# Patient Record
Sex: Male | Born: 1990 | Race: Black or African American | Hispanic: No | Marital: Single | Smoking: Current every day smoker
Health system: Southern US, Community
[De-identification: ages and names within clinical notes are randomized; demographics above are authoritative.]

## PROBLEM LIST (undated history)

## (undated) DIAGNOSIS — I10 Essential (primary) hypertension: Secondary | ICD-10-CM

## (undated) HISTORY — PX: OTHER SURGICAL HISTORY: SHX169

## (undated) HISTORY — PX: JOINT REPLACEMENT: SHX530

## (undated) HISTORY — PX: ABDOMINAL SURGERY: SHX537

---

## 2012-03-22 ENCOUNTER — Observation Stay: Payer: Self-pay | Admitting: Unknown Physician Specialty

## 2012-03-22 ENCOUNTER — Ambulatory Visit: Payer: Self-pay | Admitting: Unknown Physician Specialty

## 2012-03-26 ENCOUNTER — Emergency Department: Payer: Self-pay | Admitting: Emergency Medicine

## 2012-10-08 HISTORY — PX: LEG SURGERY: SHX1003

## 2013-06-07 ENCOUNTER — Emergency Department: Payer: Self-pay | Admitting: Emergency Medicine

## 2013-06-08 DIAGNOSIS — I770 Arteriovenous fistula, acquired: Secondary | ICD-10-CM | POA: Insufficient documentation

## 2013-06-08 DIAGNOSIS — J984 Other disorders of lung: Secondary | ICD-10-CM | POA: Insufficient documentation

## 2013-06-08 DIAGNOSIS — S42309A Unspecified fracture of shaft of humerus, unspecified arm, initial encounter for closed fracture: Secondary | ICD-10-CM | POA: Insufficient documentation

## 2013-06-08 DIAGNOSIS — D62 Acute posthemorrhagic anemia: Secondary | ICD-10-CM | POA: Insufficient documentation

## 2013-07-22 DIAGNOSIS — S3022XA Contusion of scrotum and testes, initial encounter: Secondary | ICD-10-CM | POA: Insufficient documentation

## 2013-07-29 ENCOUNTER — Emergency Department: Payer: Self-pay | Admitting: Emergency Medicine

## 2014-06-11 ENCOUNTER — Emergency Department: Payer: Self-pay | Admitting: Emergency Medicine

## 2014-06-11 LAB — COMPREHENSIVE METABOLIC PANEL
ALK PHOS: 98 U/L
ALT: 28 U/L
Albumin: 3.7 g/dL (ref 3.4–5.0)
Anion Gap: 10 (ref 7–16)
BILIRUBIN TOTAL: 0.2 mg/dL (ref 0.2–1.0)
BUN: 10 mg/dL (ref 7–18)
CHLORIDE: 106 mmol/L (ref 98–107)
CO2: 27 mmol/L (ref 21–32)
Calcium, Total: 9 mg/dL (ref 8.5–10.1)
Creatinine: 1.23 mg/dL (ref 0.60–1.30)
EGFR (Non-African Amer.): >60
Glucose: 87 mg/dL (ref 65–99)
OSMOLALITY: 283 (ref 275–301)
Potassium: 3.9 mmol/L (ref 3.5–5.1)
SGOT(AST): 29 U/L (ref 15–37)
Sodium: 143 mmol/L (ref 136–145)
Total Protein: 8.1 g/dL (ref 6.4–8.2)

## 2014-06-11 LAB — CBC WITH DIFFERENTIAL/PLATELET
BASOS PCT: 0.3 %
Basophil #: 0 10*3/uL (ref 0.0–0.1)
EOS ABS: 0.1 10*3/uL (ref 0.0–0.7)
EOS PCT: 1.3 %
HCT: 44.7 % (ref 40.0–52.0)
HGB: 14.4 g/dL (ref 13.0–18.0)
LYMPHS PCT: 24.2 %
Lymphocyte #: 2.8 10*3/uL (ref 1.0–3.6)
MCH: 28.3 pg (ref 26.0–34.0)
MCHC: 32.2 g/dL (ref 32.0–36.0)
MCV: 88 fL (ref 80–100)
MONO ABS: 1 x10 3/mm (ref 0.2–1.0)
MONOS PCT: 8.7 %
NEUTROS ABS: 7.5 10*3/uL — AB (ref 1.4–6.5)
NEUTROS PCT: 65.5 %
PLATELETS: 249 10*3/uL (ref 150–440)
RBC: 5.08 10*6/uL (ref 4.40–5.90)
RDW: 13.6 % (ref 11.5–14.5)
WBC: 11.5 10*3/uL — ABNORMAL HIGH (ref 3.8–10.6)

## 2014-06-11 LAB — D-DIMER(ARMC): D-Dimer: 263 ng/ml

## 2015-01-30 NOTE — Op Note (Signed)
PATIENT NAME:  Jorge Lozano, Jorge Lozano MR#:  [id number] DATE OF BIRTH:  1991-09-26  DATE OF PROCEDURE:  03/22/2012  PREOPERATIVE DIAGNOSIS: Bimalleolar fracture, right ankle.   POSTOPERATIVE DIAGNOSIS: Trimalleolar fracture, right ankle.   PROCEDURE: Open reduction internal fixation trimalleolar fracture, medial and lateral malleoli.   SURGEON: Winn JockJames C. Gabrille Kilbride, MD    ASSISTANT: None.   ANESTHESIA: Spinal.   ESTIMATED BLOOD LOSS: Negligible.   COMPLICATIONS: None.   TOURNIQUET PRESSURE: 300 mmHg.   TOURNIQUET TIME: 42 minutes.   IMPLANTS USED: Two 4.0 mm cannulated screws medially, an 8-hole one third tubular plate laterally, locking and nonlocking screws, two anterior to posterior lag screws.   COMPLICATIONS: None.   BRIEF CLINICAL NOTE AND PATHOLOGY: The patient suffered the above-mentioned fracture the day of surgery, presented to the Emergency Room early in the morning. This was reduced by the Emergency Room physician and I was consulted. The options, risks, and benefits were discussed. The patient elected to proceed with operative intervention. The patient understood the possibility of the need for hardware removal, further surgery. At the time of the procedure, the fracture reduced very well. There was good fixation. There was a small posterior lip fracture palpated.   DESCRIPTION OF PROCEDURE: Preop antibiotics, adequate spinal anesthesia, supine position, routine prepping and draping. Appropriate timeout was called. The leg was elevated after thorough scrubbing and prepping, the toes were protected with a glove. The tourniquet was inflated. A routine posterior approach was made to the fibula. It was reduced anatomically. It was held with towel clips while two screws were placed in a lag fashion from anterior to posterior.   The plate was then applied laterally. The distal portion was contoured so as not to be prominent. It was applied with a combination of locking and nonlocking  screws, the cortical screws were placed first to compress the plate nicely against the bone.   AP and lateral views showed good positioning of hardware. The anterior to posterior screws were palpated, and there was felt not to be excessive protrusion posteriorly.   Attention was then turned medially where an anteromedial incision was made over the distal tibia. Neurovascular structures were protected. Soft tissues were protected, and the fracture was exposed. The fracture site was irrigated,  reduced anatomically, and held with a towel clip while two guidewires were placed. These were checked with fluoroscopy. The two screws were then placed in routine fashion, both with excellent compression.   The tourniquet was released. AP, lateral, and mortise views showed good positioning. Hemostasis was good. The incision was thoroughly irrigated. They were closed with 0 Vicryl and staples. Soft sterile dressing was applied, followed by a posterior splint. The patient was awakened and taken to the postanesthesia care unit having tolerated the procedure well. Sponge and needle counts were reported as correct prior to and after wound closure.    ____________________________ Winn JockJames C. Gerrit Heckaliff, MD jcc:cbb Lozano: 03/22/2012 15:10:47 ET T: 03/22/2012 15:22:19 ET JOB#: [id number]  cc: Winn JockJames C. Gerrit Heckaliff, MD, <Dictator> Winn JockJAMES C Hilmer Aliberti MD ELECTRONICALLY SIGNED 03/23/2012 14:41

## 2015-01-30 NOTE — H&P (Signed)
Subjective/Chief Complaint Fell, twisted right ankle early AM    History of Present Illness Twisted last PM. Brought to ER where xrays showed bimall fx/dislocation. Reduced in ER and splinted. Admitted for surgery    Past History Neg   Past Med/Surgical Hx:  Right Ankle Fracture:   ALLERGIES:  No Known Allergies:   Family and Social History:   Family History Non-Contributory    Social History positive  tobacco, positive ETOH, negative Illicit drugs    + Tobacco Current (within 1 year)    Place of Living Home   Review of Systems:   Fever/Chills No    Cough No    Sputum No    Abdominal Pain No    Diarrhea No    Constipation No    Nausea/Vomiting No    SOB/DOE No    Chest Pain No    Dysuria No    Tolerating Diet Yes    Medications/Allergies Reviewed Medications/Allergies reviewed   Physical Exam:   GEN well developed    HEENT PERRL    NECK supple    RESP normal resp effort  clear BS    CARD regular rate    ABD denies tenderness  soft  normal BS    LYMPH negative neck    EXTR Right ankle in splint. NV intact. No other pain    SKIN normal to palpation    NEURO motor/sensory function intact    PSYCH A+O to time, place, person   Radiology Results: XRay:    15-Jun-13 05:16, Ankle Right AP and Lateral   Ankle Right AP and Lateral   REASON FOR EXAM:    ASSAULTED, ANKLE DEFORMITY  COMMENTS:   Bedside (portable):Y    PROCEDURE: DXR - DXR ANKLE RIGHT AP AND LATERAL  - Mar 22 2012  5:16AM     RESULT: AP and lateral views of the right ankle are submitted. The   patient has sustained a fracture-dislocation of the ankle. A spiral   fracture of the distal fibular metadiaphysis and of the medial malleolus   is present. There is considerable angulation of the fibular fracture and   there is marked displacement of the medial malleolus.The ankle joint   mortise is disrupted.    IMPRESSION:  The patient sustained a bimalleolar fracture-dislocation  of   the right ankle.      Verified By: DAVID A. [place], M.D., MD    15-Jun-13 06:27, Ankle Right AP and Lateral   Ankle Right AP and Lateral   REASON FOR EXAM:    POST-REDUCTION  COMMENTS:   Bedside (portable):Y    PROCEDURE: DXR - DXR ANKLE RIGHT AP AND LATERAL  - Mar 22 2012  6:27AM     RESULT: The patient had recently sustained a bimalleolar fracture. The   patient has undergone closed reduction. Alignment of the distal fibular   fracture as well as of the medial malleolar fracture is now more nearly   anatomic. The ankle joint mortise remains disrupted. The patient is in   plaster.    IMPRESSION:  There has been partial reduction ofthe bimalleolar fracture   of the right ankle.     Dictation Site: 5    Verified By: DAVID A. [place], M.D., MD     Assessment/Admission Diagnosis Right ankle bimalleollar fracture/dislocation reduced in ER with widened mortise still. Will proceed with ORIF medial and lateral. Procedure, risks and benefits discussed. All questions answered.    Plan As above   Electronic Signatures:  Celesta Gentilealiff, Shanvi Moyd C (MD)  (Signed 15-Jun-13 11:01)  Authored: CHIEF COMPLAINT and HISTORY, PAST MEDICAL/SURGIAL HISTORY, ALLERGIES, FAMILY AND SOCIAL HISTORY, REVIEW OF SYSTEMS, PHYSICAL EXAM, Radiology, ASSESSMENT AND PLAN   Last Updated: 15-Jun-13 11:01 by Celesta Gentilealiff, Lera Gaines C (MD)

## 2015-02-14 ENCOUNTER — Encounter: Payer: Self-pay | Admitting: Emergency Medicine

## 2015-02-14 ENCOUNTER — Emergency Department
Admission: EM | Admit: 2015-02-14 | Discharge: 2015-02-14 | Disposition: A | Discharge status: Home / Self Care | Payer: Self-pay | Attending: Emergency Medicine | Admitting: Emergency Medicine

## 2015-02-14 DIAGNOSIS — I10 Essential (primary) hypertension: Secondary | ICD-10-CM | POA: Insufficient documentation

## 2015-02-14 DIAGNOSIS — M545 Low back pain, unspecified: Secondary | ICD-10-CM

## 2015-02-14 DIAGNOSIS — Z720 Tobacco use: Secondary | ICD-10-CM | POA: Insufficient documentation

## 2015-02-14 HISTORY — DX: Essential (primary) hypertension: I10

## 2015-02-14 MED ORDER — IBUPROFEN 800 MG PO TABS
ORAL_TABLET | ORAL | Status: DC
Start: 2015-02-14 — End: 2015-02-15
  Filled 2015-02-14: qty 1

## 2015-02-14 MED ORDER — CYCLOBENZAPRINE HCL 10 MG PO TABS
10.0000 mg | ORAL_TABLET | Freq: Once | ORAL | Status: AC
  Administered 2015-02-14: 10 mg via ORAL

## 2015-02-14 MED ORDER — CYCLOBENZAPRINE HCL 10 MG PO TABS
ORAL_TABLET | ORAL | Status: AC
  Administered 2015-02-14: 10 mg via ORAL
  Filled 2015-02-14: qty 1

## 2015-02-14 MED ORDER — KETOROLAC TROMETHAMINE 10 MG PO TABS: 10.0000 mg | ORAL_TABLET | Freq: Three times a day (TID) | ORAL | Status: DC

## 2015-02-14 MED ORDER — IBUPROFEN 800 MG PO TABS
800.0000 mg | ORAL_TABLET | Freq: Once | ORAL | Status: AC
  Administered 2015-02-14: 800 mg via ORAL

## 2015-02-14 MED ORDER — CYCLOBENZAPRINE HCL 5 MG PO TABS: 5.0000 mg | ORAL_TABLET | Freq: Three times a day (TID) | ORAL | Status: DC | PRN
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
## 2015-02-14 NOTE — Discharge Instructions (Signed)
Back Injury Prevention °Back injuries can be extremely painful and difficult to heal. After having one back injury, you are much more likely to experience another later on. It is important to learn how to avoid injuring or re-injuring your back. The following tips can help you to prevent a back injury. °PHYSICAL FITNESS °· Exercise regularly and try to develop good tone in your abdominal muscles. Your abdominal muscles provide a lot of the support needed by your back. °· Do aerobic exercises (walking, jogging, biking, swimming) regularly. °· Do exercises that increase balance and strength (tai chi, yoga) regularly. This can decrease your risk of falling and injuring your back. °· Stretch before and after exercising. °· Maintain a healthy weight. The more you weigh, the more stress is placed on your back. For every pound of weight, 10 times that amount of pressure is placed on the back. °DIET °· Talk to your caregiver about how much calcium and vitamin D you need per day. These nutrients help to prevent weakening of the bones (osteoporosis). Osteoporosis can cause broken (fractured) bones that lead to back pain. °· Include good sources of calcium in your diet, such as dairy products, green, leafy vegetables, and products with calcium added (fortified). °· Include good sources of vitamin D in your diet, such as milk and foods that are fortified with vitamin D. °· Consider taking a nutritional supplement or a multivitamin if needed. °· Stop smoking if you smoke. °POSTURE °· Sit and stand up straight. Avoid leaning forward when you sit or hunching over when you stand. °· Choose chairs with good low back (lumbar) support. °· If you work at a desk, sit close to your work so you do not need to lean over. Keep your chin tucked in. Keep your neck drawn back and elbows bent at a right angle. Your arms should look like the letter "L." °· Sit high and close to the steering wheel when you drive. Add a lumbar support to your car  seat if needed. °· Avoid sitting or standing in one position for too long. Take breaks to get up, stretch, and walk around at least once every hour. Take breaks if you are driving for long periods of time. °· Sleep on your side with your knees slightly bent, or sleep on your back with a pillow under your knees. Do not sleep on your stomach. °LIFTING, TWISTING, AND REACHING °· Avoid heavy lifting, especially repetitive lifting. If you must do heavy lifting: °· Stretch before lifting. °· Work slowly. °· Rest between lifts. °· Use carts and dollies to move objects when possible. °· Make several small trips instead of carrying 1 heavy load. °· Ask for help when you need it. °· Ask for help when moving big, awkward objects. °· Follow these steps when lifting: °· Stand with your feet shoulder-width apart. °· Get as close to the object as you can. Do not try to pick up heavy objects that are far from your body. °· Use handles or lifting straps if they are available. °· Bend at your knees. Squat down, but keep your heels off the floor. °· Keep your shoulders pulled back, your chin tucked in, and your back straight. °· Lift the object slowly, tightening the muscles in your legs, abdomen, and buttocks. Keep the object as close to the center of your body as possible. °· When you put a load down, use these same guidelines in reverse. °· Do not: °· Lift the object above your waist. °·   Twist at the waist while lifting or carrying a load. Move your feet if you need to turn, not your waist.  Bend over without bending at your knees.  Avoid reaching over your head, across a table, or for an object on a high surface. OTHER TIPS  Avoid wet floors and keep sidewalks clear of ice to prevent falls.  Do not sleep on a mattress that is too soft or too hard.  Keep items that are used frequently within easy reach.  Put heavier objects on shelves at waist level and lighter objects on lower or higher shelves.  Find ways to  decrease your stress, such as exercise, massage, or relaxation techniques. Stress can build up in your muscles. Tense muscles are more vulnerable to injury.  Seek treatment for depression or anxiety if needed. These conditions can increase your risk of developing back pain. SEEK MEDICAL CARE IF:  You injure your back.  You have questions about diet, exercise, or other ways to prevent back injuries. MAKE SURE YOU:  Understand these instructions.  Will watch your condition.  Will get help right away if you are not doing well or get worse. Document Released: 11/01/2004 Document Revised: 12/17/2011 Document Reviewed: 11/05/2011 [employer] Patient Information 2015 [employer]. This information is not intended to replace advice given to you by your health care provider. Make sure you discuss any questions you have with your health care provider.  Back Pain, Adult Low back pain is very common. About 1 in 5 people have back pain.The cause of low back pain is rarely dangerous. The pain often gets better over time.About half of people with a sudden onset of back pain feel better in just 2 weeks. About 8 in 10 people feel better by 6 weeks.  CAUSES Some common causes of back pain include:  Strain of the muscles or ligaments supporting the spine.  Wear and tear (degeneration) of the spinal discs.  Arthritis.  Direct injury to the back. DIAGNOSIS Most of the time, the direct cause of low back pain is not known.However, back pain can be treated effectively even when the exact cause of the pain is unknown.Answering your caregiver's questions about your overall health and symptoms is one of the most accurate ways to make sure the cause of your pain is not dangerous. If your caregiver needs more information, he or she may order lab work or imaging tests (X-rays or MRIs).However, even if imaging tests show changes in your back, this usually does not require surgery. HOME CARE INSTRUCTIONS For  many people, back pain returns.Since low back pain is rarely dangerous, it is often a condition that people can learn to manageon their own.   Remain active. It is stressful on the back to sit or stand in one place. Do not sit, drive, or stand in one place for more than 30 minutes at a time. Take short walks on level surfaces as soon as pain allows.Try to increase the length of time you walk each day.  Do not stay in bed.Resting more than 1 or 2 days can delay your recovery.  Do not avoid exercise or work.Your body is made to move.It is not dangerous to be active, even though your back may hurt.Your back will likely heal faster if you return to being active before your pain is gone.  Pay attention to your body when you bend and lift. Many people have less discomfortwhen lifting if they bend their knees, keep the load close to their bodies,and   avoid twisting. Often, the most comfortable positions are those that put less stress on your recovering back.  Find a comfortable position to sleep. Use a firm mattress and lie on your side with your knees slightly bent. If you lie on your back, put a pillow under your knees.  Only take over-the-counter or prescription medicines as directed by your caregiver. Over-the-counter medicines to reduce pain and inflammation are often the most helpful.Your caregiver may prescribe muscle relaxant drugs.These medicines help dull your pain so you can more quickly return to your normal activities and healthy exercise.  Put ice on the injured area.  Put ice in a plastic bag.  Place a towel between your skin and the bag.  Leave the ice on for 15-20 minutes, 03-04 times a day for the first 2 to 3 days. After that, ice and heat may be alternated to reduce pain and spasms.  Ask your caregiver about trying back exercises and gentle massage. This may be of some benefit.  Avoid feeling anxious or stressed.Stress increases muscle tension and can worsen back  pain.It is important to recognize when you are anxious or stressed and learn ways to manage it.Exercise is a great option. SEEK MEDICAL CARE IF:  You have pain that is not relieved with rest or medicine.  You have pain that does not improve in 1 week.  You have new symptoms.  You are generally not feeling well. SEEK IMMEDIATE MEDICAL CARE IF:   You have pain that radiates from your back into your legs.  You develop new bowel or bladder control problems.  You have unusual weakness or numbness in your arms or legs.  You develop nausea or vomiting.  You develop abdominal pain.  You feel faint. Document Released: 09/24/2005 Document Revised: 03/25/2012 Document Reviewed: 01/26/2014 [facility] Patient Information 2015 [place], [place]. This information is not intended to replace advice given to you by your health care provider. Make sure you discuss any questions you have with your health care provider.  Take the prescription meds a directed.  Follow-up with [facility] as needed.
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
## 2015-02-16 ENCOUNTER — Emergency Department: Payer: Self-pay

## 2015-02-16 ENCOUNTER — Emergency Department
Admission: EM | Admit: 2015-02-16 | Discharge: 2015-02-16 | Disposition: A | Discharge status: Home / Self Care | Payer: Self-pay | Attending: Emergency Medicine | Admitting: Emergency Medicine

## 2015-02-16 DIAGNOSIS — Z79899 Other long term (current) drug therapy: Secondary | ICD-10-CM | POA: Insufficient documentation

## 2015-02-16 DIAGNOSIS — I10 Essential (primary) hypertension: Secondary | ICD-10-CM | POA: Insufficient documentation

## 2015-02-16 DIAGNOSIS — W3400XS Accidental discharge from unspecified firearms or gun, sequela: Secondary | ICD-10-CM

## 2015-02-16 DIAGNOSIS — Z720 Tobacco use: Secondary | ICD-10-CM | POA: Insufficient documentation

## 2015-02-16 DIAGNOSIS — S21409S Unspecified open wound of unspecified back wall of thorax with penetration into thoracic cavity, sequela: Secondary | ICD-10-CM | POA: Insufficient documentation

## 2015-02-16 MED ORDER — HYDROCODONE-ACETAMINOPHEN 5-325 MG PO TABS
ORAL_TABLET | ORAL | Status: AC
  Filled 2015-02-16: qty 2

## 2015-02-16 MED ORDER — IBUPROFEN 800 MG PO TABS: 800.0000 mg | ORAL_TABLET | Freq: Three times a day (TID) | ORAL | Status: DC | PRN

## 2015-02-16 MED ORDER — METHOCARBAMOL 500 MG PO TABS: 500.0000 mg | ORAL_TABLET | Freq: Four times a day (QID) | ORAL | Status: DC | PRN

## 2015-02-16 MED ORDER — HYDROCODONE-ACETAMINOPHEN 5-325 MG PO TABS
2.0000 | ORAL_TABLET | Freq: Once | ORAL | Status: AC
  Administered 2015-02-16: 2 via ORAL

## 2015-02-16 MED ORDER — KETOROLAC TROMETHAMINE 60 MG/2ML IM SOLN
INTRAMUSCULAR | Status: AC
  Administered 2015-02-16: 60 mg
  Filled 2015-02-16: qty 2

## 2015-02-16 MED ORDER — KETOROLAC TROMETHAMINE 30 MG/ML IJ SOLN: 60.0000 mg | Freq: Once | INTRAMUSCULAR | Status: AC

## 2015-02-16 MED ORDER — HYDROCODONE-ACETAMINOPHEN 5-325 MG PO TABS
1.0000 | ORAL_TABLET | ORAL | Status: DC | PRN
Start: 2015-02-16 — End: 2019-11-07
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
## 2015-06-22 ENCOUNTER — Emergency Department: Payer: Self-pay

## 2015-06-22 ENCOUNTER — Encounter: Payer: Self-pay | Admitting: Emergency Medicine

## 2015-06-22 ENCOUNTER — Emergency Department
Admission: EM | Admit: 2015-06-22 | Discharge: 2015-06-22 | Disposition: A | Discharge status: Home / Self Care | Payer: Self-pay | Attending: Emergency Medicine | Admitting: Emergency Medicine

## 2015-06-22 DIAGNOSIS — I10 Essential (primary) hypertension: Secondary | ICD-10-CM | POA: Insufficient documentation

## 2015-06-22 DIAGNOSIS — Z720 Tobacco use: Secondary | ICD-10-CM | POA: Insufficient documentation

## 2015-06-22 DIAGNOSIS — Z791 Long term (current) use of non-steroidal anti-inflammatories (NSAID): Secondary | ICD-10-CM | POA: Insufficient documentation

## 2015-06-22 DIAGNOSIS — J208 Acute bronchitis due to other specified organisms: Secondary | ICD-10-CM

## 2015-06-22 MED ORDER — ALBUTEROL SULFATE HFA 108 (90 BASE) MCG/ACT IN AERS: INHALATION_SPRAY | RESPIRATORY_TRACT | Status: DC
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
## 2015-06-22 NOTE — ED Notes (Signed)
Patient to ED with report of 4-5 day history of cough, congestion and muscle soreness from coughing. Patient reports mildly productive cough.

## 2015-06-22 NOTE — Discharge Instructions (Signed)
You have been seen in the Emergency Department (ED) today for a likely viral illness.  Please drink plenty of clear fluids (water, Gatorade, chicken broth, etc).  You may use Tylenol and/or Motrin according to label instructions.  You can alternate between the two without any side effects.   Please follow up with your doctor as listed above.  Call your doctor or return to the Emergency Department (ED) if you are unable to tolerate fluids due to vomiting, have worsening trouble breathing, become extremely tired or difficult to awaken, or if you develop any other symptoms that concern you.   Acute Bronchitis Bronchitis is when the airways that extend from the windpipe into the lungs get red, puffy, and painful (inflamed). Bronchitis often causes thick spit (mucus) to develop. This leads to a cough. A cough is the most common symptom of bronchitis. In acute bronchitis, the condition usually begins suddenly and goes away over time (usually in 2 weeks). Smoking, allergies, and asthma can make bronchitis worse. Repeated episodes of bronchitis may cause more lung problems. HOME CARE  Rest.  Drink enough fluids to keep your pee (urine) clear or pale yellow (unless you need to limit fluids as told by your doctor).  Only take over-the-counter or prescription medicines as told by your doctor.  Avoid smoking and secondhand smoke. These can make bronchitis worse. If you are a smoker, think about using nicotine gum or skin patches. Quitting smoking will help your lungs heal faster.  Reduce the chance of getting bronchitis again by:  Washing your hands often.  Avoiding people with cold symptoms.  Trying not to touch your hands to your mouth, nose, or eyes.  Follow up with your doctor as told. GET HELP IF: Your symptoms do not improve after 1 week of treatment. Symptoms include:  Cough.  Fever.  Coughing up thick spit.  Body aches.  Chest congestion.  Chills.  Shortness of breath.  Sore  throat. GET HELP RIGHT AWAY IF:   You have an increased fever.  You have chills.  You have severe shortness of breath.  You have bloody thick spit (sputum).  You throw up (vomit) often.  You lose too much body fluid (dehydration).  You have a severe headache.  You faint. MAKE SURE YOU:   Understand these instructions.  Will watch your condition.  Will get help right away if you are not doing well or get worse. Document Released: 03/12/2008 Document Revised: 05/27/2013 Document Reviewed: 03/17/2013 [facility] Patient Information 2015 [place], [place]. This information is not intended to replace advice given to you by your health care provider. Make sure you discuss any questions you have with your health care provider.  Viral Infections A viral infection can be caused by different types of viruses.Most viral infections are not serious and resolve on their own. However, some infections may cause severe symptoms and may lead to further complications. SYMPTOMS Viruses can frequently cause:  Minor sore throat.  Aches and pains.  Headaches.  Runny nose.  Different types of rashes.  Watery eyes.  Tiredness.  Cough.  Loss of appetite.  Gastrointestinal infections, resulting in nausea, vomiting, and diarrhea. These symptoms do not respond to antibiotics because the infection is not caused by bacteria. However, you might catch a bacterial infection following the viral infection. This is sometimes called a "superinfection." Symptoms of such a bacterial infection may include:  Worsening sore throat with pus and difficulty swallowing.  Swollen neck glands.  Chills and a high or persistent fever.  Severe headache.  Tenderness over the sinuses.  Persistent overall ill feeling (malaise), muscle aches, and tiredness (fatigue).  Persistent cough.  Yellow, green, or brown mucus production with coughing. HOME CARE INSTRUCTIONS   Only take over-the-counter or  prescription medicines for pain, discomfort, diarrhea, or fever as directed by your caregiver.  Drink enough water and fluids to keep your urine clear or pale yellow. Sports drinks can provide valuable electrolytes, sugars, and hydration.  Get plenty of rest and maintain proper nutrition. Soups and broths with crackers or rice are fine. SEEK IMMEDIATE MEDICAL CARE IF:   You have severe headaches, shortness of breath, chest pain, neck pain, or an unusual rash.  You have uncontrolled vomiting, diarrhea, or you are unable to keep down fluids.  You or your child has an oral temperature above 102 F (38.9 C), not controlled by medicine.  Your baby is older than 3 months with a rectal temperature of 102 F (38.9 C) or higher.  Your baby is 37 months old or younger with a rectal temperature of 100.4 F (38 C) or higher. MAKE SURE YOU:   Understand these instructions.  Will watch your condition.  Will get help right away if you are not doing well or get worse. Document Released: 07/04/2005 Document Revised: 12/17/2011 Document Reviewed: 01/29/2011 [facility] Patient Information 2015 [place], [place]. This information is not intended to replace advice given to you by your health care provider. Make sure you discuss any questions you have with your health care provider.
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
## 2018-05-22 ENCOUNTER — Encounter: Payer: Self-pay | Admitting: Emergency Medicine

## 2018-05-22 ENCOUNTER — Emergency Department
Admission: EM | Admit: 2018-05-22 | Discharge: 2018-05-23 | Disposition: A | Discharge status: Home / Self Care | Payer: Self-pay | Attending: Emergency Medicine | Admitting: Emergency Medicine

## 2018-05-22 ENCOUNTER — Other Ambulatory Visit: Payer: Self-pay

## 2018-05-22 DIAGNOSIS — G8929 Other chronic pain: Secondary | ICD-10-CM | POA: Insufficient documentation

## 2018-05-22 DIAGNOSIS — I10 Essential (primary) hypertension: Secondary | ICD-10-CM | POA: Insufficient documentation

## 2018-05-22 DIAGNOSIS — M545 Low back pain: Secondary | ICD-10-CM | POA: Insufficient documentation

## 2018-05-22 DIAGNOSIS — Z79899 Other long term (current) drug therapy: Secondary | ICD-10-CM | POA: Insufficient documentation

## 2018-05-22 DIAGNOSIS — F17200 Nicotine dependence, unspecified, uncomplicated: Secondary | ICD-10-CM | POA: Insufficient documentation

## 2018-05-22 DIAGNOSIS — M549 Dorsalgia, unspecified: Secondary | ICD-10-CM

## 2018-05-22 DIAGNOSIS — R059 Cough, unspecified: Secondary | ICD-10-CM

## 2018-05-22 DIAGNOSIS — R05 Cough: Secondary | ICD-10-CM

## 2018-05-22 MED ORDER — SODIUM CHLORIDE 0.9 % IV BOLUS
1000.0000 mL | Freq: Once | INTRAVENOUS | Status: AC
  Administered 2018-05-22: 1000 mL via INTRAVENOUS

## 2018-05-22 MED ORDER — PROCHLORPERAZINE EDISYLATE 10 MG/2ML IJ SOLN
10.0000 mg | Freq: Once | INTRAMUSCULAR | Status: AC
  Administered 2018-05-22: 10 mg via INTRAVENOUS
  Filled 2018-05-22: qty 2
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
## 2018-05-23 NOTE — ED Notes (Signed)
Pt states he wants to leave AMA. Explained to pt he needed to increase fluid intake if he left. MD aware. MD agrees to d/c.

## 2018-05-23 NOTE — Discharge Instructions (Addendum)
Please seek medical attention for any high fevers, chest pain, shortness of breath, change in behavior, persistent vomiting, bloody stool or any other new or concerning symptoms.  

## 2019-08-17 ENCOUNTER — Other Ambulatory Visit: Payer: Self-pay

## 2019-11-07 ENCOUNTER — Emergency Department
Admission: EM | Admit: 2019-11-07 | Discharge: 2019-11-07 | Disposition: A | Discharge status: Home / Self Care | Payer: Self-pay | Attending: Emergency Medicine | Admitting: Emergency Medicine

## 2019-11-07 ENCOUNTER — Encounter: Payer: Self-pay | Admitting: Emergency Medicine

## 2019-11-07 ENCOUNTER — Other Ambulatory Visit: Payer: Self-pay

## 2019-11-07 DIAGNOSIS — H9201 Otalgia, right ear: Secondary | ICD-10-CM | POA: Insufficient documentation

## 2019-11-07 DIAGNOSIS — R519 Headache, unspecified: Secondary | ICD-10-CM | POA: Insufficient documentation

## 2019-11-07 DIAGNOSIS — J029 Acute pharyngitis, unspecified: Secondary | ICD-10-CM

## 2019-11-07 DIAGNOSIS — R070 Pain in throat: Secondary | ICD-10-CM | POA: Insufficient documentation

## 2019-11-07 DIAGNOSIS — Z20822 Contact with and (suspected) exposure to covid-19: Secondary | ICD-10-CM | POA: Insufficient documentation

## 2019-11-07 DIAGNOSIS — I10 Essential (primary) hypertension: Secondary | ICD-10-CM | POA: Insufficient documentation

## 2019-11-07 DIAGNOSIS — F17200 Nicotine dependence, unspecified, uncomplicated: Secondary | ICD-10-CM | POA: Insufficient documentation

## 2019-11-07 LAB — INFLUENZA PANEL BY PCR (TYPE A & B)
Influenza A By PCR: NEGATIVE
Influenza B By PCR: NEGATIVE

## 2019-11-07 MED ORDER — DEXAMETHASONE SODIUM PHOSPHATE 10 MG/ML IJ SOLN
10.0000 mg | Freq: Once | INTRAMUSCULAR | Status: AC
  Administered 2019-11-07: 16:00:00 10 mg via INTRAMUSCULAR
  Filled 2019-11-07: qty 1

## 2019-11-07 MED ORDER — FLUTICASONE PROPIONATE 50 MCG/ACT NA SUSP: 1.0000 | Freq: Every day | NASAL | 0 refills | Status: DC

## 2019-11-07 MED ORDER — CETIRIZINE HCL 10 MG PO TABS: 10.0000 mg | ORAL_TABLET | Freq: Every day | ORAL | 0 refills | Status: DC

## 2019-11-07 NOTE — ED Provider Notes (Signed)
[facility] Emergency Department Provider Note ____________________________________________  Time seen: 1525  I have reviewed the triage vital signs and the nursing notes.  HISTORY  Chief Complaint  Headache   HPI Jorge Lozano is a 29 y.o. male presents to the ER today with complaint of right-sided headache, facial pain, nasal congestion, ear pain and sore throat.  He reports this started 2 days ago.  He describes the headache as pressure.  He reports some watery drainage from his right eye but denies dizziness or visual changes.  He is blowing yellow mucus out of his nose.  He describes the ear pain as fullness without discharge or loss of hearing.  He denies difficulty swallowing.  He denies runny nose, loss of taste/smell, cough or shortness of breath.  He denies fever, chills or body aches.  He has a history of allergies and he does smoke daily.  He has not had sick contacts or exposure to Covid that he is aware of.  He has not taken any medications over-the-counter prior to his arrival.  Past Medical History:  Diagnosis Date  . Hypertension     There are no problems to display for this patient.   Past Surgical History:  Procedure Laterality Date  . ABDOMINAL SURGERY    . JOINT REPLACEMENT     left arm surgery; rod in left arm  . LEG SURGERY Left 2014  . right ankle      Prior to Admission medications   Medication Sig Start Date End Date Taking? Authorizing Provider  cetirizine (ZYRTEC ALLERGY) 10 MG tablet Take 1 tablet (10 mg total) by mouth daily. 11/07/19 11/06/20  Jearld Fenton, NP  fluticasone (FLONASE) 50 MCG/ACT nasal spray Place 1 spray into both nostrils daily. 11/07/19 11/06/20  Jearld Fenton, NP  albuterol (PROVENTIL HFA;VENTOLIN HFA) 108 (90 BASE) MCG/ACT inhaler Inhale 4-6 puffs by mouth every 4 hours as needed for wheezing, cough, and/or shortness of breath 06/22/15 11/07/19  Hinda Kehr, MD    Allergies Morphine and  related  Family History  Problem Relation Age of Onset  . Hypertension Mother   . Hypertension Father     Social History Social History   Tobacco Use  . Smoking status: Current Every Day Smoker  . Smokeless tobacco: Never Used  Substance Use Topics  . Alcohol use: Yes  . Drug use: No    Review of Systems  Constitutional: Negative for fever, chills or body aches. Eyes: Negative for visual changes. ENT: Positive for facial pain, nasal congestion, ear pain and sore throat. Negative for runny nose or cough.  Cardiovascular: Negative for chest pain or chest tightness. Respiratory: Negative for cough, shortness of breath. Gastrointestinal: Negative for nausea, vomiting and diarrhea. Neurological: Positive for headache. Negative for focal weakness, tingling or numbness. ____________________________________________  PHYSICAL EXAM:  VITAL SIGNS: ED Triage Vitals  Enc Vitals Group     BP 11/07/19 1405 (!) 136/96     Pulse Rate 11/07/19 1405 92     Resp 11/07/19 1405 20     Temp 11/07/19 1405 98.6 F (37 C)     Temp Source 11/07/19 1405 Oral     SpO2 11/07/19 1405 98 %     Weight 11/07/19 1403 290 lb (131.5 kg)     Height 11/07/19 1403 6\' 2"  (1.88 m)     Head Circumference --      Peak Flow --      Pain Score 11/07/19 1403 9  Pain Loc --      Pain Edu? --      Excl. in GC? --     Constitutional: Alert and oriented. Ill appearing, but in no distress. Head: Normocephalic. Pain with palpation of right frontal, ethmoidal and maxillary sinuses. Eyes: Conjunctivae are normal. PERRL. Normal extraocular movements. Right periorbital edema noted. Ears: Canals clear. TMs intact bilaterally. + serous effusion on the right. Nose: Erythema noted of right nasal turbinates. Mouth/Throat: Mucous membranes are moist. No posterior pharynx erythema or exudate noted. Uvula rises midline.  Hematological/Lymphatic/Immunological: No cervical lymphadenopathy. Cardiovascular: Normal rate,  regular rhythm.  Respiratory: Normal respiratory effort. No wheezes/rales/rhonchi. Neurologic:  Normal gait without ataxia. Normal speech and language. No gross focal neurologic deficits are appreciated. Skin:  Skin is warm, dry and intact. No rash noted. ____________________________________________     INITIAL IMPRESSION / ASSESSMENT AND PLAN / ED COURSE  Acute Sinus Headache, Facial Pain, Nasal Congestion, Ear Pain and Sore Throat:  DDX include viral sinusitis, Covid 19, Influenza Will obtain swab for influenza and Covid Decadron 10 mg IM x 1 RX for Flonase 1 spray right nostril BID x 3 days RX for Zyrtec 10 mg PO daily x 7 days  Return precautions discussed ____________________________________________  FINAL CLINICAL IMPRESSION(S) / ED DIAGNOSES  Final diagnoses:  Acute intractable headache, unspecified headache type  Facial pain  Right ear pain  Sore throat      Lorre Munroe, NP 11/07/19 1622    Charlynne Pander, MD 11/07/19 [phone]

## 2019-11-07 NOTE — Discharge Instructions (Addendum)
You were evaluated today for acute headache, facial pain, ear pain and sore throat. You will be called about your Covid swab results. You received a steroid injection today. I have given you a RX for Zyrtec and Flonase- use as directed. Return if symptoms persist > 1 week, you develop persistent fever over 101.5, or have green/brown mucous as this may be signs of a bacterial infection. You should self quarantine at home until your Covid results are back.

## 2019-11-07 NOTE — ED Triage Notes (Signed)
Pt c/o frontal HA, sinus pressure, R ear pain, and sore throat. Pt states pain x several days. A&O x 4, ambulatory without difficulty.

## 2019-11-08 LAB — SARS CORONAVIRUS 2 (TAT 6-24 HRS): SARS Coronavirus 2: NEGATIVE

## 2021-06-19 ENCOUNTER — Emergency Department (HOSPITAL_COMMUNITY): Payer: Self-pay

## 2021-06-19 ENCOUNTER — Other Ambulatory Visit: Payer: Self-pay

## 2021-06-19 ENCOUNTER — Inpatient Hospital Stay (HOSPITAL_COMMUNITY)
Admission: EM | Admit: 2021-06-19 | Discharge: 2021-06-21 | DRG: 982 | Disposition: A | Discharge status: Home / Self Care | Payer: Self-pay | Source: Other Acute Inpatient Hospital | Attending: Vascular Surgery | Admitting: Vascular Surgery

## 2021-06-19 ENCOUNTER — Emergency Department
Admission: EM | Admit: 2021-06-19 | Discharge: 2021-06-19 | Disposition: A | Discharge status: Other Acute Inpatient Hospital | Payer: Self-pay | Attending: Emergency Medicine | Admitting: Emergency Medicine

## 2021-06-19 DIAGNOSIS — S41131A Puncture wound without foreign body of right upper arm, initial encounter: Principal | ICD-10-CM

## 2021-06-19 DIAGNOSIS — F17200 Nicotine dependence, unspecified, uncomplicated: Secondary | ICD-10-CM | POA: Insufficient documentation

## 2021-06-19 DIAGNOSIS — R52 Pain, unspecified: Secondary | ICD-10-CM

## 2021-06-19 DIAGNOSIS — S45191A Other specified injury of brachial artery, right side, initial encounter: Secondary | ICD-10-CM | POA: Diagnosis present

## 2021-06-19 DIAGNOSIS — Z885 Allergy status to narcotic agent status: Secondary | ICD-10-CM

## 2021-06-19 DIAGNOSIS — S5411XA Injury of median nerve at forearm level, right arm, initial encounter: Secondary | ICD-10-CM | POA: Diagnosis present

## 2021-06-19 DIAGNOSIS — Z20822 Contact with and (suspected) exposure to covid-19: Secondary | ICD-10-CM | POA: Insufficient documentation

## 2021-06-19 DIAGNOSIS — I10 Essential (primary) hypertension: Secondary | ICD-10-CM | POA: Insufficient documentation

## 2021-06-19 DIAGNOSIS — W3400XA Accidental discharge from unspecified firearms or gun, initial encounter: Secondary | ICD-10-CM

## 2021-06-19 DIAGNOSIS — D62 Acute posthemorrhagic anemia: Secondary | ICD-10-CM | POA: Diagnosis present

## 2021-06-19 DIAGNOSIS — Z79899 Other long term (current) drug therapy: Secondary | ICD-10-CM

## 2021-06-19 DIAGNOSIS — Y92410 Unspecified street and highway as the place of occurrence of the external cause: Secondary | ICD-10-CM

## 2021-06-19 DIAGNOSIS — Z6841 Body Mass Index (BMI) 40.0 and over, adult: Secondary | ICD-10-CM

## 2021-06-19 DIAGNOSIS — Z23 Encounter for immunization: Secondary | ICD-10-CM

## 2021-06-19 DIAGNOSIS — S80812A Abrasion, left lower leg, initial encounter: Secondary | ICD-10-CM | POA: Diagnosis present

## 2021-06-19 DIAGNOSIS — Y249XXA Unspecified firearm discharge, undetermined intent, initial encounter: Secondary | ICD-10-CM

## 2021-06-19 DIAGNOSIS — Z96612 Presence of left artificial shoulder joint: Secondary | ICD-10-CM | POA: Insufficient documentation

## 2021-06-19 DIAGNOSIS — S41101A Unspecified open wound of right upper arm, initial encounter: Secondary | ICD-10-CM | POA: Insufficient documentation

## 2021-06-19 DIAGNOSIS — S41131S Puncture wound without foreign body of right upper arm, sequela: Secondary | ICD-10-CM

## 2021-06-19 DIAGNOSIS — Z8249 Family history of ischemic heart disease and other diseases of the circulatory system: Secondary | ICD-10-CM

## 2021-06-19 LAB — BASIC METABOLIC PANEL
Anion gap: 15 (ref 5–15)
BUN: 13 mg/dL (ref 6–20)
CO2: 20 mmol/L — ABNORMAL LOW (ref 22–32)
Calcium: 9.3 mg/dL (ref 8.9–10.3)
Chloride: 102 mmol/L (ref 98–111)
Creatinine, Ser: 1.42 mg/dL — ABNORMAL HIGH (ref 0.61–1.24)
GFR, Estimated: >60 mL/min (ref >60)
Glucose, Bld: 111 mg/dL — ABNORMAL HIGH (ref 70–99)
Potassium: 3.6 mmol/L (ref 3.5–5.1)
Sodium: 137 mmol/L (ref 135–145)

## 2021-06-19 LAB — CBC WITH DIFFERENTIAL/PLATELET
Abs Immature Granulocytes: 0.04 10*3/uL (ref 0.00–0.07)
Basophils Absolute: 0 10*3/uL (ref 0.0–0.1)
Basophils Relative: 0 %
Eosinophils Absolute: 0.2 10*3/uL (ref 0.0–0.5)
Eosinophils Relative: 1 %
HCT: 44.7 % (ref 39.0–52.0)
Hemoglobin: 14.8 g/dL (ref 13.0–17.0)
Immature Granulocytes: 0 %
Lymphocytes Relative: 39 %
Lymphs Abs: 5.8 10*3/uL — ABNORMAL HIGH (ref 0.7–4.0)
MCH: 28.8 pg (ref 26.0–34.0)
MCHC: 33.1 g/dL (ref 30.0–36.0)
MCV: 87.1 fL (ref 80.0–100.0)
Monocytes Absolute: 1.1 10*3/uL — ABNORMAL HIGH (ref 0.1–1.0)
Monocytes Relative: 8 %
Neutro Abs: 7.5 10*3/uL (ref 1.7–7.7)
Neutrophils Relative %: 52 %
Platelets: 248 10*3/uL (ref 150–400)
RBC: 5.13 MIL/uL (ref 4.22–5.81)
RDW: 13.5 % (ref 11.5–15.5)
WBC: 14.6 10*3/uL — ABNORMAL HIGH (ref 4.0–10.5)
nRBC: 0 % (ref 0.0–0.2)

## 2021-06-19 LAB — RESP PANEL BY RT-PCR (FLU A&B, COVID) ARPGX2
Influenza A by PCR: NEGATIVE
Influenza B by PCR: NEGATIVE
SARS Coronavirus 2 by RT PCR: NEGATIVE

## 2021-06-19 MED ORDER — HYDROMORPHONE HCL 1 MG/ML IJ SOLN
INTRAMUSCULAR | Status: AC
  Administered 2021-06-19: 1 mg via INTRAVENOUS
  Filled 2021-06-19: qty 1

## 2021-06-19 MED ORDER — SODIUM CHLORIDE 0.9 % IV SOLN: INTRAVENOUS | Status: DC

## 2021-06-19 MED ORDER — FENTANYL CITRATE PF 50 MCG/ML IJ SOSY
PREFILLED_SYRINGE | INTRAMUSCULAR | Status: AC
  Filled 2021-06-19: qty 2

## 2021-06-19 MED ORDER — TETANUS-DIPHTH-ACELL PERTUSSIS 5-2.5-18.5 LF-MCG/0.5 IM SUSY: 0.5000 mL | PREFILLED_SYRINGE | Freq: Once | INTRAMUSCULAR | Status: DC

## 2021-06-19 MED ORDER — FENTANYL CITRATE PF 50 MCG/ML IJ SOSY
100.0000 ug | PREFILLED_SYRINGE | Freq: Once | INTRAMUSCULAR | Status: AC
  Administered 2021-06-19: 100 ug via INTRAVENOUS

## 2021-06-19 MED ORDER — HYDROMORPHONE HCL 1 MG/ML IJ SOLN
1.0000 mg | Freq: Once | INTRAMUSCULAR | Status: AC
  Administered 2021-06-19: 1 mg via INTRAVENOUS

## 2021-06-19 MED ORDER — HYDROMORPHONE BOLUS VIA INFUSION: 1.0000 mg | Freq: Once | INTRAVENOUS | Status: DC

## 2021-06-19 NOTE — ED Notes (Signed)
REPORT PROVIDED TO Lowanda Foster, CHARGE RN AT [facility].
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
## 2021-06-20 ENCOUNTER — Emergency Department (HOSPITAL_COMMUNITY): Payer: Self-pay | Admitting: Certified Registered Nurse Anesthetist

## 2021-06-20 ENCOUNTER — Encounter (HOSPITAL_COMMUNITY): Payer: Self-pay | Admitting: Certified Registered Nurse Anesthetist

## 2021-06-20 ENCOUNTER — Encounter (HOSPITAL_COMMUNITY)
Admission: EM | Disposition: A | Discharge status: Home / Self Care | Payer: Self-pay | Source: Other Acute Inpatient Hospital | Attending: Vascular Surgery

## 2021-06-20 DIAGNOSIS — S41131A Puncture wound without foreign body of right upper arm, initial encounter: Secondary | ICD-10-CM

## 2021-06-20 DIAGNOSIS — S41101A Unspecified open wound of right upper arm, initial encounter: Secondary | ICD-10-CM

## 2021-06-20 DIAGNOSIS — Y249XXA Unspecified firearm discharge, undetermined intent, initial encounter: Secondary | ICD-10-CM

## 2021-06-20 DIAGNOSIS — R202 Paresthesia of skin: Secondary | ICD-10-CM

## 2021-06-20 DIAGNOSIS — S41131S Puncture wound without foreign body of right upper arm, sequela: Secondary | ICD-10-CM

## 2021-06-20 DIAGNOSIS — S45111A Laceration of brachial artery, right side, initial encounter: Secondary | ICD-10-CM

## 2021-06-20 DIAGNOSIS — S45211A Laceration of axillary or brachial vein, right side, initial encounter: Secondary | ICD-10-CM

## 2021-06-20 HISTORY — PX: ARTERY REPAIR: SHX559

## 2021-06-20 HISTORY — PX: VEIN HARVEST: SHX6363

## 2021-06-20 HISTORY — PX: THROMBECTOMY BRACHIAL ARTERY: SHX6649

## 2021-06-20 LAB — PROTIME-INR
INR: 1 (ref 0.8–1.2)
Prothrombin Time: 13.2 seconds (ref 11.4–15.2)

## 2021-06-20 LAB — TYPE AND SCREEN

## 2021-06-20 LAB — CBC
HCT: 39.4 % (ref 39.0–52.0)
Hemoglobin: 12.9 g/dL — ABNORMAL LOW (ref 13.0–17.0)
MCH: 28.7 pg (ref 26.0–34.0)
MCHC: 32.7 g/dL (ref 30.0–36.0)
MCV: 87.6 fL (ref 80.0–100.0)
Platelets: 271 10*3/uL (ref 150–400)
RBC: 4.5 MIL/uL (ref 4.22–5.81)
RDW: 13.4 % (ref 11.5–15.5)
WBC: 22.1 10*3/uL — ABNORMAL HIGH (ref 4.0–10.5)
nRBC: 0 % (ref 0.0–0.2)

## 2021-06-20 LAB — SAMPLE TO BLOOD BANK

## 2021-06-20 LAB — LIPID PANEL
Cholesterol: 200 mg/dL (ref 0–200)
HDL: 39 mg/dL — ABNORMAL LOW (ref >40)
LDL Cholesterol: 145 mg/dL — ABNORMAL HIGH (ref 0–99)
Total CHOL/HDL Ratio: 5.1 RATIO
Triglycerides: 80 mg/dL (ref <150)
VLDL: 16 mg/dL (ref 0–40)

## 2021-06-20 LAB — ABO/RH: ABO/RH(D): O POS

## 2021-06-20 LAB — PREPARE RBC (CROSSMATCH)

## 2021-06-20 LAB — CREATININE, SERUM
Creatinine, Ser: 1.21 mg/dL (ref 0.61–1.24)
GFR, Estimated: >60 mL/min (ref >60)

## 2021-06-20 LAB — ETHANOL: Alcohol, Ethyl (B): <10 mg/dL (ref <10)

## 2021-06-20 SURGERY — SURGICAL PROCUREMENT, VEIN
Anesthesia: General | Site: Leg Upper | Laterality: Right

## 2021-06-20 MED ORDER — PHENYLEPHRINE HCL (PRESSORS) 10 MG/ML IV SOLN
INTRAVENOUS | Status: DC | PRN
  Administered 2021-06-20 (×3): 80 ug via INTRAVENOUS

## 2021-06-20 MED ORDER — ACETAMINOPHEN 325 MG PO TABS: 325.0000 mg | ORAL_TABLET | ORAL | Status: DC | PRN

## 2021-06-20 MED ORDER — MIDAZOLAM HCL 5 MG/5ML IJ SOLN
INTRAMUSCULAR | Status: DC | PRN
  Administered 2021-06-20: 2 mg via INTRAVENOUS

## 2021-06-20 MED ORDER — DEXTROSE 5 % IV SOLN
INTRAVENOUS | Status: DC | PRN
  Administered 2021-06-20: 3 g via INTRAVENOUS

## 2021-06-20 MED ORDER — DEXAMETHASONE SODIUM PHOSPHATE 10 MG/ML IJ SOLN
INTRAMUSCULAR | Status: AC
  Filled 2021-06-20: qty 1

## 2021-06-20 MED ORDER — SODIUM CHLORIDE 0.9% IV SOLUTION: Freq: Once | INTRAVENOUS | Status: DC

## 2021-06-20 MED ORDER — POTASSIUM CHLORIDE CRYS ER 20 MEQ PO TBCR: 20.0000 meq | EXTENDED_RELEASE_TABLET | Freq: Every day | ORAL | Status: DC | PRN

## 2021-06-20 MED ORDER — SODIUM CHLORIDE 0.9 % IV SOLN: 500.0000 mL | Freq: Once | INTRAVENOUS | Status: DC | PRN

## 2021-06-20 MED ORDER — ROCURONIUM BROMIDE 10 MG/ML (PF) SYRINGE
PREFILLED_SYRINGE | INTRAVENOUS | Status: AC
  Filled 2021-06-20: qty 10

## 2021-06-20 MED ORDER — HEPARIN SODIUM (PORCINE) 5000 UNIT/ML IJ SOLN
5000.0000 [IU] | Freq: Three times a day (TID) | INTRAMUSCULAR | Status: DC
  Administered 2021-06-21 (×2): 5000 [IU] via SUBCUTANEOUS
  Filled 2021-06-20 (×2): qty 1

## 2021-06-20 MED ORDER — ONDANSETRON HCL 4 MG/2ML IJ SOLN
INTRAMUSCULAR | Status: AC
  Filled 2021-06-20: qty 2

## 2021-06-20 MED ORDER — BISACODYL 5 MG PO TBEC: 5.0000 mg | DELAYED_RELEASE_TABLET | Freq: Every day | ORAL | Status: DC | PRN

## 2021-06-20 MED ORDER — PHENYLEPHRINE 40 MCG/ML (10ML) SYRINGE FOR IV PUSH (FOR BLOOD PRESSURE SUPPORT)
PREFILLED_SYRINGE | INTRAVENOUS | Status: AC
  Filled 2021-06-20: qty 50

## 2021-06-20 MED ORDER — ASPIRIN EC 81 MG PO TBEC
81.0000 mg | DELAYED_RELEASE_TABLET | Freq: Every day | ORAL | Status: DC
  Administered 2021-06-21: 81 mg via ORAL
  Filled 2021-06-20: qty 1

## 2021-06-20 MED ORDER — POLYETHYLENE GLYCOL 3350 17 G PO PACK: 17.0000 g | PACK | Freq: Every day | ORAL | Status: DC | PRN

## 2021-06-20 MED ORDER — LIDOCAINE 2% (20 MG/ML) 5 ML SYRINGE
INTRAMUSCULAR | Status: AC
  Filled 2021-06-20: qty 5

## 2021-06-20 MED ORDER — DEXMEDETOMIDINE (PRECEDEX) IN NS 20 MCG/5ML (4 MCG/ML) IV SYRINGE
PREFILLED_SYRINGE | INTRAVENOUS | Status: DC | PRN
  Administered 2021-06-20: 20 ug via INTRAVENOUS
  Administered 2021-06-20: 12 ug via INTRAVENOUS
  Administered 2021-06-20: 8 ug via INTRAVENOUS

## 2021-06-20 MED ORDER — LABETALOL HCL 5 MG/ML IV SOLN: 10.0000 mg | INTRAVENOUS | Status: DC | PRN

## 2021-06-20 MED ORDER — HEPARIN SODIUM (PORCINE) 1000 UNIT/ML IJ SOLN
INTRAMUSCULAR | Status: AC
  Filled 2021-06-20: qty 3

## 2021-06-20 MED ORDER — HEPARIN 6000 UNIT IRRIGATION SOLUTION
Status: DC | PRN
  Administered 2021-06-20: 1

## 2021-06-20 MED ORDER — KETAMINE HCL 50 MG/5ML IJ SOSY
PREFILLED_SYRINGE | INTRAMUSCULAR | Status: AC
  Filled 2021-06-20: qty 5

## 2021-06-20 MED ORDER — ONDANSETRON HCL 4 MG/2ML IJ SOLN: 4.0000 mg | Freq: Four times a day (QID) | INTRAMUSCULAR | Status: DC | PRN

## 2021-06-20 MED ORDER — HEPARIN SODIUM (PORCINE) 1000 UNIT/ML IJ SOLN
INTRAMUSCULAR | Status: DC | PRN
  Administered 2021-06-20: 5000 [IU] via INTRAVENOUS
  Administered 2021-06-20: 10000 [IU] via INTRAVENOUS

## 2021-06-20 MED ORDER — OXYCODONE-ACETAMINOPHEN 5-325 MG PO TABS
1.0000 | ORAL_TABLET | ORAL | Status: DC | PRN
  Administered 2021-06-20 – 2021-06-21 (×4): 2 via ORAL
  Filled 2021-06-20 (×4): qty 2

## 2021-06-20 MED ORDER — PROPOFOL 10 MG/ML IV BOLUS
INTRAVENOUS | Status: AC
  Filled 2021-06-20: qty 20

## 2021-06-20 MED ORDER — FENTANYL CITRATE (PF) 250 MCG/5ML IJ SOLN
INTRAMUSCULAR | Status: AC
  Filled 2021-06-20: qty 5

## 2021-06-20 MED ORDER — ALUM & MAG HYDROXIDE-SIMETH 200-200-20 MG/5ML PO SUSP: 15.0000 mL | ORAL | Status: DC | PRN

## 2021-06-20 MED ORDER — ALBUMIN HUMAN 5 % IV SOLN: INTRAVENOUS | Status: DC | PRN

## 2021-06-20 MED ORDER — PROTAMINE SULFATE 10 MG/ML IV SOLN
INTRAVENOUS | Status: AC
  Filled 2021-06-20: qty 10

## 2021-06-20 MED ORDER — SUCCINYLCHOLINE CHLORIDE 200 MG/10ML IV SOSY
PREFILLED_SYRINGE | INTRAVENOUS | Status: DC | PRN
  Administered 2021-06-20: 160 mg via INTRAVENOUS

## 2021-06-20 MED ORDER — DIPHENHYDRAMINE HCL 25 MG PO CAPS
25.0000 mg | ORAL_CAPSULE | Freq: Four times a day (QID) | ORAL | Status: DC | PRN
Start: 2021-06-20 — End: 2021-06-21
  Administered 2021-06-20: 25 mg via ORAL
  Filled 2021-06-20: qty 1

## 2021-06-20 MED ORDER — HYDROMORPHONE HCL 1 MG/ML IJ SOLN
0.5000 mg | INTRAMUSCULAR | Status: DC | PRN
  Administered 2021-06-20 – 2021-06-21 (×7): 1 mg via INTRAVENOUS
  Filled 2021-06-20 (×8): qty 1

## 2021-06-20 MED ORDER — KETAMINE HCL 10 MG/ML IJ SOLN
INTRAMUSCULAR | Status: DC | PRN
  Administered 2021-06-20: 30 mg via INTRAVENOUS
  Administered 2021-06-20: 20 mg via INTRAVENOUS

## 2021-06-20 MED ORDER — LIDOCAINE 2% (20 MG/ML) 5 ML SYRINGE
INTRAMUSCULAR | Status: AC
  Filled 2021-06-20: qty 10

## 2021-06-20 MED ORDER — GUAIFENESIN-DM 100-10 MG/5ML PO SYRP: 15.0000 mL | ORAL_SOLUTION | ORAL | Status: DC | PRN

## 2021-06-20 MED ORDER — LACTATED RINGERS IV SOLN: INTRAVENOUS | Status: DC | PRN

## 2021-06-20 MED ORDER — SUCCINYLCHOLINE CHLORIDE 200 MG/10ML IV SOSY
PREFILLED_SYRINGE | INTRAVENOUS | Status: AC
  Filled 2021-06-20: qty 30

## 2021-06-20 MED ORDER — HEMOSTATIC AGENTS (NO CHARGE) OPTIME
TOPICAL | Status: DC | PRN
  Administered 2021-06-20: 1 via TOPICAL

## 2021-06-20 MED ORDER — PANTOPRAZOLE SODIUM 40 MG PO TBEC
40.0000 mg | DELAYED_RELEASE_TABLET | Freq: Every day | ORAL | Status: DC
  Administered 2021-06-20 – 2021-06-21 (×2): 40 mg via ORAL
  Filled 2021-06-20 (×2): qty 1

## 2021-06-20 MED ORDER — CEFAZOLIN SODIUM-DEXTROSE 2-4 GM/100ML-% IV SOLN
2.0000 g | Freq: Three times a day (TID) | INTRAVENOUS | Status: AC
  Administered 2021-06-20 (×2): 2 g via INTRAVENOUS
  Filled 2021-06-20 (×2): qty 100

## 2021-06-20 MED ORDER — SUGAMMADEX SODIUM 200 MG/2ML IV SOLN
INTRAVENOUS | Status: DC | PRN
  Administered 2021-06-20: 400 mg via INTRAVENOUS

## 2021-06-20 MED ORDER — ROCURONIUM BROMIDE 100 MG/10ML IV SOLN
INTRAVENOUS | Status: DC | PRN
Start: 2021-06-20 — End: 2021-06-20
  Administered 2021-06-20: 20 mg via INTRAVENOUS
  Administered 2021-06-20: 80 mg via INTRAVENOUS

## 2021-06-20 MED ORDER — PROTAMINE SULFATE 10 MG/ML IV SOLN
INTRAVENOUS | Status: DC | PRN
  Administered 2021-06-20: 25 mg via INTRAVENOUS

## 2021-06-20 MED ORDER — MAGNESIUM SULFATE 2 GM/50ML IV SOLN: 2.0000 g | Freq: Every day | INTRAVENOUS | Status: DC | PRN

## 2021-06-20 MED ORDER — 0.9 % SODIUM CHLORIDE (POUR BTL) OPTIME
TOPICAL | Status: DC | PRN
  Administered 2021-06-20: 1000 mL

## 2021-06-20 MED ORDER — ONDANSETRON HCL 4 MG/2ML IJ SOLN
INTRAMUSCULAR | Status: DC | PRN
  Administered 2021-06-20: 4 mg via INTRAVENOUS

## 2021-06-20 MED ORDER — MIDAZOLAM HCL 2 MG/2ML IJ SOLN
INTRAMUSCULAR | Status: AC
  Filled 2021-06-20: qty 2

## 2021-06-20 MED ORDER — PROPOFOL 10 MG/ML IV BOLUS
INTRAVENOUS | Status: DC | PRN
Start: 2021-06-20 — End: 2021-06-20
  Administered 2021-06-20: 200 mg via INTRAVENOUS

## 2021-06-20 MED ORDER — PHENOL 1.4 % MT LIQD: 1.0000 | OROMUCOSAL | Status: DC | PRN

## 2021-06-20 MED ORDER — FENTANYL CITRATE (PF) 100 MCG/2ML IJ SOLN
INTRAMUSCULAR | Status: DC | PRN
  Administered 2021-06-20 (×2): 50 ug via INTRAVENOUS
  Administered 2021-06-20: 150 ug via INTRAVENOUS

## 2021-06-20 MED ORDER — ACETAMINOPHEN 650 MG RE SUPP: 325.0000 mg | RECTAL | Status: DC | PRN

## 2021-06-20 MED ORDER — METOPROLOL TARTRATE 5 MG/5ML IV SOLN
2.0000 mg | INTRAVENOUS | Status: DC | PRN
  Administered 2021-06-20: 2 mg via INTRAVENOUS
  Filled 2021-06-20: qty 5

## 2021-06-20 MED ORDER — DOCUSATE SODIUM 100 MG PO CAPS
100.0000 mg | ORAL_CAPSULE | Freq: Every day | ORAL | Status: DC
  Administered 2021-06-21: 100 mg via ORAL
  Filled 2021-06-20: qty 1

## 2021-06-20 MED ORDER — CEFAZOLIN SODIUM 1 G IJ SOLR
INTRAMUSCULAR | Status: AC
  Filled 2021-06-20: qty 30

## 2021-06-20 MED ORDER — LIDOCAINE HCL (CARDIAC) PF 100 MG/5ML IV SOSY
PREFILLED_SYRINGE | INTRAVENOUS | Status: DC | PRN
  Administered 2021-06-20: 60 mg via INTRAVENOUS

## 2021-06-20 MED ORDER — SODIUM CHLORIDE 0.9 % IV SOLN: INTRAVENOUS | Status: DC

## 2021-06-20 MED ORDER — EPHEDRINE 5 MG/ML INJ
INTRAVENOUS | Status: AC
  Filled 2021-06-20: qty 15

## 2021-06-20 MED ORDER — DEXAMETHASONE SODIUM PHOSPHATE 10 MG/ML IJ SOLN
INTRAMUSCULAR | Status: DC | PRN
  Administered 2021-06-20: 10 mg via INTRAVENOUS

## 2021-06-20 MED ORDER — HYDRALAZINE HCL 20 MG/ML IJ SOLN: 5.0000 mg | INTRAMUSCULAR | Status: DC | PRN

## 2021-06-20 SURGICAL SUPPLY — 54 items
ADH SKN CLS APL DERMABOND .7 (GAUZE/BANDAGES/DRESSINGS) ×3
AGENT HMST 10 BLLW SHRT CANN (HEMOSTASIS) ×3
BAG COUNTER SPONGE SURGICOUNT (BAG) ×4 IMPLANT
BAG SPNG CNTER NS LX DISP (BAG) ×3
CANISTER SUCT 3000ML PPV (MISCELLANEOUS) ×4 IMPLANT
CATH EMB 3FR 40CM (CATHETERS) ×8 IMPLANT
CLIP LIGATING EXTRA MED SLVR (CLIP) ×4 IMPLANT
CLIP LIGATING EXTRA SM BLUE (MISCELLANEOUS) ×4 IMPLANT
CLIP VESOCCLUDE SM WIDE 24/CT (CLIP) ×4 IMPLANT
DERMABOND ADVANCED (GAUZE/BANDAGES/DRESSINGS) ×1
DERMABOND ADVANCED .7 DNX12 (GAUZE/BANDAGES/DRESSINGS) ×3 IMPLANT
DRAIN SNY 10X20 3/4 PERF (WOUND CARE) IMPLANT
DRAPE INCISE IOBAN 66X45 STRL (DRAPES) ×4 IMPLANT
DRAPE X-RAY CASS 24X20 (DRAPES) IMPLANT
DRSG COVADERM 4X8 (GAUZE/BANDAGES/DRESSINGS) ×8 IMPLANT
ELECT CAUTERY BLADE 6.4 (BLADE) ×4 IMPLANT
ELECT REM PT RETURN 9FT ADLT (ELECTROSURGICAL) ×4
ELECTRODE REM PT RTRN 9FT ADLT (ELECTROSURGICAL) ×3 IMPLANT
EVACUATOR SILICONE 100CC (DRAIN) IMPLANT
GAUZE 4X4 16PLY ~~LOC~~+RFID DBL (SPONGE) IMPLANT
GLOVE SURG ENC MOIS LTX SZ7.5 (GLOVE) ×4 IMPLANT
GOWN STRL REUS W/ TWL LRG LVL3 (GOWN DISPOSABLE) ×6 IMPLANT
GOWN STRL REUS W/ TWL XL LVL3 (GOWN DISPOSABLE) ×3 IMPLANT
GOWN STRL REUS W/TWL LRG LVL3 (GOWN DISPOSABLE) ×8
GOWN STRL REUS W/TWL XL LVL3 (GOWN DISPOSABLE) ×4
HEMOSTAT HEMOBLAST BELLOWS (HEMOSTASIS) ×4 IMPLANT
HEMOSTAT SNOW SURGICEL 2X4 (HEMOSTASIS) IMPLANT
INSERT FOGARTY SM (MISCELLANEOUS) IMPLANT
KIT BASIN OR (CUSTOM PROCEDURE TRAY) ×4 IMPLANT
KIT TURNOVER KIT B (KITS) ×4 IMPLANT
NS IRRIG 1000ML POUR BTL (IV SOLUTION) ×4 IMPLANT
PACK PERIPHERAL VASCULAR (CUSTOM PROCEDURE TRAY) ×4 IMPLANT
PAD ARMBOARD 7.5X6 YLW CONV (MISCELLANEOUS) ×8 IMPLANT
PENCIL BUTTON HOLSTER BLD 10FT (ELECTRODE) ×4 IMPLANT
SET COLLECT BLD 21X3/4 12 (NEEDLE) IMPLANT
STAPLER VISISTAT 35W (STAPLE) ×8 IMPLANT
STOPCOCK 4 WAY LG BORE MALE ST (IV SETS) IMPLANT
SUT GORETEX 5 0 TT13 24 (SUTURE) IMPLANT
SUT GORETEX 6.0 TT9 (SUTURE) IMPLANT
SUT MNCRL AB 4-0 PS2 18 (SUTURE) ×4 IMPLANT
SUT PROLENE 5 0 C 1 36 (SUTURE) ×8 IMPLANT
SUT PROLENE 6 0 BV (SUTURE) ×8 IMPLANT
SUT PROLENE 6 0 CC (SUTURE) ×8 IMPLANT
SUT SILK 2 0 SH (SUTURE) IMPLANT
SUT VIC AB 2-0 CT1 27 (SUTURE) ×8
SUT VIC AB 2-0 CT1 TAPERPNT 27 (SUTURE) ×6 IMPLANT
SUT VIC AB 3-0 SH 27 (SUTURE) ×4
SUT VIC AB 3-0 SH 27X BRD (SUTURE) ×3 IMPLANT
SYR 3ML LL SCALE MARK (SYRINGE) ×4 IMPLANT
TAPE UMBILICAL COTTON 1/8X30 (MISCELLANEOUS) IMPLANT
TOWEL GREEN STERILE (TOWEL DISPOSABLE) ×4 IMPLANT
TRAY FOLEY MTR SLVR 16FR STAT (SET/KITS/TRAYS/PACK) ×4 IMPLANT
TUBING EXTENTION W/L.L. (IV SETS) IMPLANT
WATER STERILE IRR 1000ML POUR (IV SOLUTION) ×4 IMPLANT
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
## 2021-06-20 NOTE — Progress Notes (Signed)
Patient's heart rate elevating above 160 with ambulation, educated pt on heart rate rising, encouraged pt to stay in bed and use urinal at this time, prn provided for elevated heart rate as ordered. Will continue to monitor.
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
## 2021-06-20 NOTE — Anesthesia Procedure Notes (Signed)
Procedure Name: Intubation Date/Time: 06/20/2021 1:39 AM Performed by: Meade Hogeland T, CRNA Pre-anesthesia Checklist: Patient identified, Emergency Drugs available, Suction available and Patient being monitored Patient Re-evaluated:Patient Re-evaluated prior to induction Oxygen Delivery Method: Circle system utilized Preoxygenation: Pre-oxygenation with 100% oxygen Induction Type: IV induction, Rapid sequence and Cricoid Pressure applied Ventilation: Mask ventilation without difficulty Laryngoscope Size: Mac and 4 Grade View: Grade II Tube type: Oral Tube size: 7.5 mm Number of attempts: 1 Airway Equipment and Method: Stylet and Oral airway Placement Confirmation: ETT inserted through vocal cords under direct vision, positive ETCO2 and breath sounds checked- equal and bilateral Secured at: 22 cm Tube secured with: Tape Dental Injury: Teeth and Oropharynx as per pre-operative assessment
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
## 2021-06-20 NOTE — Plan of Care (Signed)
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
## 2021-06-20 NOTE — Transfer of Care (Signed)
Immediate Anesthesia Transfer of Care Note  Patient: Jorge Lozano  Procedure(s) Performed: RIGHT GREATER SAPHENOUS VEIN HARVEST (Right: Leg Upper) RIGHT BRACHIAL ARTERY BYPASS USING SAPHENOUS VEIN (Right: Arm Upper) THROMBECTOMY RIGHT BRACHIAL ARTERY (Right: Arm Upper)  Patient Location: PACU  Anesthesia Type:General  Level of Consciousness: drowsy  Airway & Oxygen Therapy: Patient Spontanous Breathing and Patient connected to nasal cannula oxygen  Post-op Assessment: Report given to RN, Post -op Vital signs reviewed and stable and Patient moving all extremities  Post vital signs: Reviewed and stable  Last Vitals:  Vitals Value Taken Time  BP 130/103 06/20/21 0415  Temp    Pulse 111 06/20/21 0420  Resp 22 06/20/21 0420  SpO2 92 % 06/20/21 0420  Vitals shown include unvalidated device data.  Last Pain:  Vitals:   06/20/21 0100  TempSrc:   PainSc: 10-Worst pain ever         Complications: No notable events documented.

## 2021-06-21 ENCOUNTER — Encounter (HOSPITAL_COMMUNITY): Payer: Self-pay | Admitting: Vascular Surgery

## 2021-06-21 ENCOUNTER — Other Ambulatory Visit (HOSPITAL_COMMUNITY): Payer: Self-pay

## 2021-06-21 DIAGNOSIS — Z95828 Presence of other vascular implants and grafts: Secondary | ICD-10-CM

## 2021-06-21 LAB — CBC
HCT: 35.1 % — ABNORMAL LOW (ref 39.0–52.0)
Hemoglobin: 11.4 g/dL — ABNORMAL LOW (ref 13.0–17.0)
MCH: 28.6 pg (ref 26.0–34.0)
MCHC: 32.5 g/dL (ref 30.0–36.0)
MCV: 88 fL (ref 80.0–100.0)
Platelets: 251 10*3/uL (ref 150–400)
RBC: 3.99 MIL/uL — ABNORMAL LOW (ref 4.22–5.81)
RDW: 13.7 % (ref 11.5–15.5)
WBC: 23.7 10*3/uL — ABNORMAL HIGH (ref 4.0–10.5)
nRBC: 0 % (ref 0.0–0.2)

## 2021-06-21 LAB — BASIC METABOLIC PANEL
Anion gap: 8 (ref 5–15)
BUN: 13 mg/dL (ref 6–20)
CO2: 25 mmol/L (ref 22–32)
Calcium: 8.9 mg/dL (ref 8.9–10.3)
Chloride: 101 mmol/L (ref 98–111)
Creatinine, Ser: 1.09 mg/dL (ref 0.61–1.24)
GFR, Estimated: >60 mL/min (ref >60)
Glucose, Bld: 148 mg/dL — ABNORMAL HIGH (ref 70–99)
Potassium: 3.8 mmol/L (ref 3.5–5.1)
Sodium: 134 mmol/L — ABNORMAL LOW (ref 135–145)

## 2021-06-21 MED ORDER — WHITE PETROLATUM EX OINT
TOPICAL_OINTMENT | CUTANEOUS | Status: AC
  Filled 2021-06-21: qty 28.35

## 2021-06-21 MED ORDER — LIP MEDEX EX OINT
TOPICAL_OINTMENT | CUTANEOUS | Status: DC | PRN
  Filled 2021-06-21: qty 7

## 2021-06-21 MED ORDER — TETANUS-DIPHTH-ACELL PERTUSSIS 5-2.5-18.5 LF-MCG/0.5 IM SUSY
0.5000 mL | PREFILLED_SYRINGE | Freq: Once | INTRAMUSCULAR | Status: AC
  Administered 2021-06-21: 0.5 mL via INTRAMUSCULAR
  Filled 2021-06-21: qty 0.5

## 2021-06-21 MED ORDER — TETANUS-DIPHTH-ACELL PERTUSSIS 5-2.5-18.5 LF-MCG/0.5 IM SUSY
0.5000 mL | PREFILLED_SYRINGE | Freq: Once | INTRAMUSCULAR | 0 refills | Status: AC
  Filled 2021-06-21: qty 0.5, 1d supply, fill #0

## 2021-06-21 MED ORDER — HYDROCODONE-ACETAMINOPHEN 5-325 MG PO TABS
1.0000 | ORAL_TABLET | ORAL | 0 refills | Status: DC | PRN
  Filled 2021-06-21: qty 20, 4d supply, fill #0

## 2021-06-21 MED ORDER — ASPIRIN 81 MG PO TBEC
81.0000 mg | DELAYED_RELEASE_TABLET | Freq: Every day | ORAL | 11 refills | Status: AC
  Filled 2021-06-21: qty 30, 30d supply, fill #0
  Filled 2022-01-16: qty 30, 30d supply, fill #1
  Filled 2022-04-18: qty 90, 90d supply, fill #0
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
## 2021-06-21 NOTE — Plan of Care (Signed)

## 2021-06-21 NOTE — Anesthesia Postprocedure Evaluation (Signed)
Anesthesia Post Note  Patient: Jorge Lozano  Procedure(s) Performed: RIGHT GREATER SAPHENOUS VEIN HARVEST (Right: Leg Upper) RIGHT BRACHIAL ARTERY BYPASS USING SAPHENOUS VEIN (Right: Arm Upper) THROMBECTOMY RIGHT BRACHIAL ARTERY (Right: Arm Upper)     Patient location during evaluation: PACU Anesthesia Type: General Level of consciousness: awake and alert Pain management: pain level controlled Vital Signs Assessment: post-procedure vital signs reviewed and stable Respiratory status: spontaneous breathing, nonlabored ventilation, respiratory function stable and patient connected to nasal cannula oxygen Cardiovascular status: blood pressure returned to baseline and stable Postop Assessment: no apparent nausea or vomiting Anesthetic complications: no   No notable events documented.  Last Vitals:  Vitals:   06/20/21 1940 06/20/21 2356  BP: 127/69 134/76  Pulse: (!) 115 (!) 101  Resp: (!) 21 18  Temp: 37 C 37 C  SpO2: 90%     Last Pain:  Vitals:   06/21/21 0519  TempSrc:   PainSc: 8                  Yohana Bartha S

## 2021-06-21 NOTE — Progress Notes (Signed)
Ortho/hand service consulted.
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
## 2021-06-22 LAB — TYPE AND SCREEN
ABO/RH(D): O POS
Antibody Screen: NEGATIVE
Unit division: 0
Unit division: 0

## 2021-06-22 LAB — BPAM RBC
Blood Product Expiration Date: 202210092359
Blood Product Expiration Date: 202210132359
ISSUE DATE / TIME: 202209120828
Unit Type and Rh: 5100
Unit Type and Rh: 5100

## 2021-06-23 ENCOUNTER — Emergency Department (HOSPITAL_COMMUNITY)
Admit: 2021-06-23 | Discharge: 2021-06-23 | Disposition: A | Discharge status: Home / Self Care | Payer: Self-pay | Attending: Emergency Medicine | Admitting: Emergency Medicine

## 2021-06-23 ENCOUNTER — Emergency Department (HOSPITAL_COMMUNITY): Payer: Self-pay

## 2021-06-23 ENCOUNTER — Other Ambulatory Visit: Payer: Self-pay

## 2021-06-23 ENCOUNTER — Emergency Department (HOSPITAL_COMMUNITY)
Admission: EM | Admit: 2021-06-23 | Discharge: 2021-06-23 | Disposition: A | Discharge status: Home / Self Care | Payer: Self-pay | Attending: Emergency Medicine | Admitting: Emergency Medicine

## 2021-06-23 ENCOUNTER — Encounter (HOSPITAL_COMMUNITY): Payer: Self-pay

## 2021-06-23 DIAGNOSIS — R609 Edema, unspecified: Secondary | ICD-10-CM

## 2021-06-23 DIAGNOSIS — Z5321 Procedure and treatment not carried out due to patient leaving prior to being seen by health care provider: Secondary | ICD-10-CM | POA: Insufficient documentation

## 2021-06-23 DIAGNOSIS — M79601 Pain in right arm: Secondary | ICD-10-CM | POA: Insufficient documentation

## 2021-06-23 DIAGNOSIS — M7989 Other specified soft tissue disorders: Secondary | ICD-10-CM

## 2021-06-23 DIAGNOSIS — M79621 Pain in right upper arm: Secondary | ICD-10-CM

## 2021-06-23 DIAGNOSIS — R209 Unspecified disturbances of skin sensation: Secondary | ICD-10-CM | POA: Insufficient documentation

## 2021-06-23 LAB — CBC WITH DIFFERENTIAL/PLATELET
Abs Immature Granulocytes: 0.05 10*3/uL (ref 0.00–0.07)
Basophils Absolute: 0 10*3/uL (ref 0.0–0.1)
Basophils Relative: 0 %
Eosinophils Absolute: 0.2 10*3/uL (ref 0.0–0.5)
Eosinophils Relative: 1 %
HCT: 38 % — ABNORMAL LOW (ref 39.0–52.0)
Hemoglobin: 12.1 g/dL — ABNORMAL LOW (ref 13.0–17.0)
Immature Granulocytes: 0 %
Lymphocytes Relative: 25 %
Lymphs Abs: 2.7 10*3/uL (ref 0.7–4.0)
MCH: 28.3 pg (ref 26.0–34.0)
MCHC: 31.8 g/dL (ref 30.0–36.0)
MCV: 88.8 fL (ref 80.0–100.0)
Monocytes Absolute: 0.7 10*3/uL (ref 0.1–1.0)
Monocytes Relative: 6 %
Neutro Abs: 7.5 10*3/uL (ref 1.7–7.7)
Neutrophils Relative %: 68 %
Platelets: 303 10*3/uL (ref 150–400)
RBC: 4.28 MIL/uL (ref 4.22–5.81)
RDW: 13.7 % (ref 11.5–15.5)
WBC: 11.1 10*3/uL — ABNORMAL HIGH (ref 4.0–10.5)
nRBC: 0 % (ref 0.0–0.2)

## 2021-06-23 LAB — BASIC METABOLIC PANEL
Anion gap: 8 (ref 5–15)
BUN: 10 mg/dL (ref 6–20)
CO2: 28 mmol/L (ref 22–32)
Calcium: 8.9 mg/dL (ref 8.9–10.3)
Chloride: 101 mmol/L (ref 98–111)
Creatinine, Ser: 0.88 mg/dL (ref 0.61–1.24)
GFR, Estimated: >60 mL/min (ref >60)
Glucose, Bld: 86 mg/dL (ref 70–99)
Potassium: 3.8 mmol/L (ref 3.5–5.1)
Sodium: 137 mmol/L (ref 135–145)

## 2021-06-23 MED ORDER — OXYCODONE-ACETAMINOPHEN 5-325 MG PO TABS
1.0000 | ORAL_TABLET | Freq: Once | ORAL | Status: AC
  Administered 2021-06-23: 1 via ORAL
  Filled 2021-06-23: qty 1

## 2021-06-23 NOTE — Progress Notes (Signed)
Right upper extremity arterial and venous duplex completed. Refer to "CV Proc" under chart review to view preliminary results.  06/23/2021 12:04 PM Eula Fried., MHA, RVT, RDCS, RDMS
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
## 2021-06-23 NOTE — ED Notes (Signed)
No answer when called for room, pt not in bathroom

## 2021-06-24 ENCOUNTER — Encounter (HOSPITAL_COMMUNITY): Payer: Self-pay | Admitting: Emergency Medicine

## 2021-06-24 ENCOUNTER — Emergency Department (HOSPITAL_COMMUNITY)
Admission: EM | Admit: 2021-06-24 | Discharge: 2021-06-24 | Disposition: A | Discharge status: Home / Self Care | Payer: Self-pay | Attending: Emergency Medicine | Admitting: Emergency Medicine

## 2021-06-24 DIAGNOSIS — M79601 Pain in right arm: Secondary | ICD-10-CM | POA: Insufficient documentation

## 2021-06-24 DIAGNOSIS — G8918 Other acute postprocedural pain: Secondary | ICD-10-CM | POA: Insufficient documentation

## 2021-06-24 DIAGNOSIS — Z96698 Presence of other orthopedic joint implants: Secondary | ICD-10-CM | POA: Insufficient documentation

## 2021-06-24 DIAGNOSIS — F17200 Nicotine dependence, unspecified, uncomplicated: Secondary | ICD-10-CM | POA: Insufficient documentation

## 2021-06-24 DIAGNOSIS — I10 Essential (primary) hypertension: Secondary | ICD-10-CM | POA: Insufficient documentation

## 2021-06-24 DIAGNOSIS — Z7982 Long term (current) use of aspirin: Secondary | ICD-10-CM | POA: Insufficient documentation

## 2021-06-24 MED ORDER — DIPHENHYDRAMINE HCL 25 MG PO CAPS
25.0000 mg | ORAL_CAPSULE | Freq: Once | ORAL | Status: AC
  Administered 2021-06-24: 25 mg via ORAL
  Filled 2021-06-24: qty 1

## 2021-06-24 MED ORDER — OXYCODONE-ACETAMINOPHEN 5-325 MG PO TABS
2.0000 | ORAL_TABLET | Freq: Once | ORAL | Status: AC
Start: 2021-06-24 — End: 2021-06-24
  Administered 2021-06-24: 2 via ORAL
  Filled 2021-06-24: qty 2

## 2021-06-24 MED ORDER — OXYCODONE-ACETAMINOPHEN 5-325 MG PO TABS: 1.0000 | ORAL_TABLET | Freq: Four times a day (QID) | ORAL | 0 refills | Status: AC | PRN
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
## 2021-07-05 ENCOUNTER — Other Ambulatory Visit: Payer: Self-pay

## 2021-07-05 ENCOUNTER — Ambulatory Visit (INDEPENDENT_AMBULATORY_CARE_PROVIDER_SITE_OTHER): Payer: Self-pay | Admitting: Physician Assistant

## 2021-07-05 VITALS — BP 145/99 | HR 92 | Temp 98.1°F | Resp 20 | Ht 73.0 in | Wt 319.0 lb

## 2021-07-05 DIAGNOSIS — S41131A Puncture wound without foreign body of right upper arm, initial encounter: Secondary | ICD-10-CM

## 2021-07-05 DIAGNOSIS — Z9889 Other specified postprocedural states: Secondary | ICD-10-CM

## 2021-07-05 MED ORDER — OXYCODONE-ACETAMINOPHEN 5-325 MG PO TABS: 1.0000 | ORAL_TABLET | ORAL | 0 refills | Status: DC | PRN

## 2021-07-05 MED ORDER — GABAPENTIN 300 MG PO CAPS: 300.0000 mg | ORAL_CAPSULE | Freq: Every day | ORAL | 0 refills | Status: DC
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
## 2021-07-12 ENCOUNTER — Other Ambulatory Visit: Payer: Self-pay

## 2021-07-12 ENCOUNTER — Ambulatory Visit (INDEPENDENT_AMBULATORY_CARE_PROVIDER_SITE_OTHER): Payer: Self-pay | Admitting: Physician Assistant

## 2021-07-12 VITALS — BP 147/107 | HR 91 | Temp 98.0°F | Resp 20 | Ht 73.0 in | Wt 321.5 lb

## 2021-07-12 DIAGNOSIS — S41131A Puncture wound without foreign body of right upper arm, initial encounter: Secondary | ICD-10-CM
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
## 2021-07-17 ENCOUNTER — Other Ambulatory Visit: Payer: Self-pay

## 2021-07-17 ENCOUNTER — Encounter: Payer: Self-pay | Admitting: Neurology

## 2021-07-17 ENCOUNTER — Telehealth: Payer: Self-pay | Admitting: Neurology

## 2021-07-17 ENCOUNTER — Ambulatory Visit: Payer: Self-pay | Admitting: Neurology

## 2021-07-17 VITALS — BP 140/94 | HR 90 | Ht 73.0 in | Wt 316.0 lb

## 2021-07-17 DIAGNOSIS — G5611 Other lesions of median nerve, right upper limb: Secondary | ICD-10-CM

## 2021-07-17 DIAGNOSIS — S41131A Puncture wound without foreign body of right upper arm, initial encounter: Secondary | ICD-10-CM

## 2021-07-17 MED ORDER — METHYLPREDNISOLONE 4 MG PO TBPK: ORAL_TABLET | ORAL | 0 refills | Status: AC

## 2021-07-17 MED ORDER — PREGABALIN 50 MG PO CAPS: 50.0000 mg | ORAL_CAPSULE | Freq: Three times a day (TID) | ORAL | 3 refills | Status: DC

## 2021-07-17 MED ORDER — TRAMADOL HCL 50 MG PO TABS: 50.0000 mg | ORAL_TABLET | Freq: Three times a day (TID) | ORAL | 1 refills | Status: DC | PRN

## 2021-07-17 NOTE — Telephone Encounter (Signed)
Pt called stating wanting to know why the methylPREDNISolone (MEDROL DOSEPAK) 4 MG TBPK tablet and the  traMADol (ULTRAM) 50 MG tablet is costing him $400. Pt requesting a call back.

## 2021-07-17 NOTE — Telephone Encounter (Signed)
Called pt back. He has not reached out to [facility] to find out why cost is so high. He is unsure if he has Medicaid coverage. He will call them at [phone] to inquire if he has coverage. If not, he will call [place] financial assistance at [phone]. It sounds like he has spoken w/ them in the past but has not followed up. I also educated him about goodrx coupons to use towards rx's to help w/ cost.
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
## 2021-07-19 ENCOUNTER — Telehealth: Payer: Self-pay | Admitting: Neurology

## 2021-07-19 NOTE — Telephone Encounter (Signed)
Pain clinic referral sent to [facility] pain clinic for Dr. Cherylann Ratel. Phone: [phone].

## 2021-08-01 ENCOUNTER — Other Ambulatory Visit: Payer: Self-pay | Admitting: Physician Assistant

## 2021-08-01 NOTE — Telephone Encounter (Signed)
Referral has also been denied by Dr. Wynn Banker @ [facility]. Sending referral to [facility]. Phone: [phone].

## 2021-08-30 ENCOUNTER — Encounter: Payer: MEDICAID | Admitting: Neurology

## 2021-09-14 ENCOUNTER — Ambulatory Visit: Payer: Self-pay | Admitting: Neurology

## 2021-10-25 ENCOUNTER — Other Ambulatory Visit (HOSPITAL_COMMUNITY): Payer: Self-pay

## 2021-10-25 ENCOUNTER — Encounter (INDEPENDENT_AMBULATORY_CARE_PROVIDER_SITE_OTHER): Payer: 59 | Admitting: Neurology

## 2021-10-25 ENCOUNTER — Encounter: Payer: MEDICAID | Admitting: Neurology

## 2021-10-25 ENCOUNTER — Encounter: Payer: Self-pay | Admitting: Neurology

## 2021-10-25 ENCOUNTER — Ambulatory Visit: Payer: 59 | Admitting: Neurology

## 2021-10-25 DIAGNOSIS — G5611 Other lesions of median nerve, right upper limb: Secondary | ICD-10-CM | POA: Diagnosis not present

## 2021-10-25 DIAGNOSIS — M79601 Pain in right arm: Secondary | ICD-10-CM | POA: Diagnosis not present

## 2021-10-25 DIAGNOSIS — S41131A Puncture wound without foreign body of right upper arm, initial encounter: Secondary | ICD-10-CM

## 2021-10-25 DIAGNOSIS — Z0289 Encounter for other administrative examinations: Secondary | ICD-10-CM

## 2021-10-25 MED ORDER — GABAPENTIN 600 MG PO TABS
ORAL_TABLET | ORAL | 5 refills | Status: DC
  Filled 2021-10-25: qty 90, fill #0
  Filled 2022-01-16: qty 90, 30d supply, fill #0

## 2021-10-25 MED ORDER — HYDROCHLOROTHIAZIDE 25 MG PO TABS
25.0000 mg | ORAL_TABLET | Freq: Every day | ORAL | 5 refills | Status: DC
  Filled 2021-10-25 – 2022-01-16 (×2): qty 30, 30d supply, fill #0
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
## 2022-01-16 ENCOUNTER — Encounter: Payer: Self-pay | Admitting: Neurology

## 2022-01-17 ENCOUNTER — Other Ambulatory Visit (HOSPITAL_COMMUNITY): Payer: Self-pay

## 2022-01-30 ENCOUNTER — Other Ambulatory Visit: Payer: Self-pay | Admitting: Neurology

## 2022-01-30 ENCOUNTER — Other Ambulatory Visit: Payer: Self-pay | Admitting: *Deleted

## 2022-01-30 ENCOUNTER — Other Ambulatory Visit (HOSPITAL_COMMUNITY): Payer: Self-pay

## 2022-01-30 MED ORDER — HYDROCHLOROTHIAZIDE 25 MG PO TABS: 25.0000 mg | ORAL_TABLET | Freq: Every day | ORAL | 5 refills | Status: AC

## 2022-01-30 MED ORDER — HYDROCHLOROTHIAZIDE 25 MG PO TABS
25.0000 mg | ORAL_TABLET | Freq: Every day | ORAL | 5 refills | Status: DC
  Filled 2022-01-30: qty 30, 30d supply, fill #0

## 2022-01-30 NOTE — Telephone Encounter (Signed)
Last OV was on 07/17/21.  ?Next OV is pending to be scheduled.  ?Last RX was written on 07/17/21 for 90 tabs.  ? ?[place] Drug Database has been reviewed.  ?

## 2022-02-20 DIAGNOSIS — Z0289 Encounter for other administrative examinations: Secondary | ICD-10-CM

## 2022-04-17 ENCOUNTER — Telehealth: Payer: Self-pay | Admitting: Neurology

## 2022-04-17 DIAGNOSIS — M79601 Pain in right arm: Secondary | ICD-10-CM

## 2022-04-17 DIAGNOSIS — G5611 Other lesions of median nerve, right upper limb: Secondary | ICD-10-CM

## 2022-04-17 DIAGNOSIS — S41131A Puncture wound without foreign body of right upper arm, initial encounter: Secondary | ICD-10-CM

## 2022-04-17 NOTE — Telephone Encounter (Signed)
Pt is calling and stated he needs documentation stating he has nerve damage. Pt would like a nurse to give him a call so he can explain what he needs in the letter.

## 2022-04-17 NOTE — Telephone Encounter (Signed)
Dr. Epimenio Foot, do you want to write a separate letter or provide EMG/NCS result report?

## 2022-04-18 ENCOUNTER — Encounter: Payer: Self-pay | Admitting: Neurology
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
## 2022-04-18 NOTE — Telephone Encounter (Addendum)
Called pt. Let him know letter ready for pick up. He verbalized understanding. Aware we are open 8-5pm tomorrow. Placed up front for pick up.

## 2022-04-18 NOTE — Addendum Note (Signed)
Addended by: Arther Abbott on: 04/18/2022 08:48 AM   Modules accepted: Orders

## 2022-04-18 NOTE — Progress Notes (Signed)
note

## 2022-04-19 ENCOUNTER — Other Ambulatory Visit (HOSPITAL_COMMUNITY): Payer: Self-pay

## 2022-04-20 ENCOUNTER — Other Ambulatory Visit (HOSPITAL_COMMUNITY): Payer: Self-pay

## 2022-04-27 ENCOUNTER — Other Ambulatory Visit (HOSPITAL_COMMUNITY): Payer: Self-pay
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
## 2022-05-24 ENCOUNTER — Other Ambulatory Visit: Payer: Self-pay | Admitting: *Deleted

## 2022-05-24 DIAGNOSIS — S41131A Puncture wound without foreign body of right upper arm, initial encounter: Secondary | ICD-10-CM

## 2022-06-01 ENCOUNTER — Ambulatory Visit (INDEPENDENT_AMBULATORY_CARE_PROVIDER_SITE_OTHER): Payer: Self-pay | Admitting: Physician Assistant

## 2022-06-01 ENCOUNTER — Ambulatory Visit (HOSPITAL_COMMUNITY)
Admission: RE | Admit: 2022-06-01 | Discharge: 2022-06-01 | Disposition: A | Discharge status: Home / Self Care | Payer: No Typology Code available for payment source | Source: Ambulatory Visit | Attending: Vascular Surgery | Admitting: Vascular Surgery

## 2022-06-01 VITALS — BP 134/85 | HR 86 | Temp 97.9°F | Ht 74.0 in | Wt 322.3 lb

## 2022-06-01 DIAGNOSIS — S41131A Puncture wound without foreign body of right upper arm, initial encounter: Secondary | ICD-10-CM | POA: Diagnosis present
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
## 2022-06-25 DIAGNOSIS — G894 Chronic pain syndrome: Secondary | ICD-10-CM | POA: Insufficient documentation

## 2022-06-25 DIAGNOSIS — M899 Disorder of bone, unspecified: Secondary | ICD-10-CM | POA: Insufficient documentation

## 2022-06-25 DIAGNOSIS — Z789 Other specified health status: Secondary | ICD-10-CM | POA: Insufficient documentation

## 2022-06-25 DIAGNOSIS — Z79899 Other long term (current) drug therapy: Secondary | ICD-10-CM | POA: Insufficient documentation
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
## 2022-06-26 DIAGNOSIS — S42291S Other displaced fracture of upper end of right humerus, sequela: Secondary | ICD-10-CM | POA: Insufficient documentation

## 2022-06-26 DIAGNOSIS — S82841S Displaced bimalleolar fracture of right lower leg, sequela: Secondary | ICD-10-CM | POA: Insufficient documentation

## 2022-06-27 ENCOUNTER — Ambulatory Visit (HOSPITAL_BASED_OUTPATIENT_CLINIC_OR_DEPARTMENT_OTHER): Payer: No Typology Code available for payment source | Admitting: Pain Medicine

## 2022-06-27 DIAGNOSIS — Z79899 Other long term (current) drug therapy: Secondary | ICD-10-CM

## 2022-06-27 DIAGNOSIS — Z91199 Patient's noncompliance with other medical treatment and regimen due to unspecified reason: Secondary | ICD-10-CM

## 2022-06-27 DIAGNOSIS — G894 Chronic pain syndrome: Secondary | ICD-10-CM

## 2022-06-27 DIAGNOSIS — M899 Disorder of bone, unspecified: Secondary | ICD-10-CM

## 2022-06-27 DIAGNOSIS — Z789 Other specified health status: Secondary | ICD-10-CM
